# Patient Record
Sex: Male | Born: 1980 | Race: Black or African American | Hispanic: No | Marital: Married | Smoking: Never smoker
Health system: Southern US, Community
[De-identification: ages and names within clinical notes are randomized; demographics above are authoritative.]

## PROBLEM LIST (undated history)

## (undated) ENCOUNTER — Emergency Department (HOSPITAL_COMMUNITY): Discharge status: Against Medical Advice | Payer: Self-pay

## (undated) DIAGNOSIS — I10 Essential (primary) hypertension: Secondary | ICD-10-CM

## (undated) HISTORY — PX: EYE SURGERY: SHX253

## (undated) HISTORY — PX: RETINAL DETACHMENT SURGERY: SHX105

---
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
## 2003-12-06 ENCOUNTER — Encounter: Admission: RE | Admit: 2003-12-06 | Discharge: 2003-12-06 | Payer: Self-pay | Admitting: Internal Medicine

## 2004-07-09 ENCOUNTER — Emergency Department (HOSPITAL_COMMUNITY): Admission: EM | Admit: 2004-07-09 | Discharge: 2004-07-10 | Payer: Self-pay | Admitting: Emergency Medicine

## 2006-01-09 ENCOUNTER — Emergency Department (HOSPITAL_COMMUNITY): Admission: EM | Admit: 2006-01-09 | Discharge: 2006-01-09 | Payer: Self-pay | Admitting: Emergency Medicine

## 2008-08-26 ENCOUNTER — Emergency Department (HOSPITAL_COMMUNITY): Admission: EM | Admit: 2008-08-26 | Discharge: 2008-08-26 | Payer: Self-pay | Admitting: Emergency Medicine

## 2009-02-15 ENCOUNTER — Emergency Department (HOSPITAL_COMMUNITY): Admission: EM | Admit: 2009-02-15 | Discharge: 2009-02-15 | Payer: Self-pay | Admitting: Emergency Medicine

## 2010-08-15 LAB — GC/CHLAMYDIA PROBE AMP, GENITAL
Chlamydia, DNA Probe: NEGATIVE
GC Probe Amp, Genital: NEGATIVE

## 2012-12-17 ENCOUNTER — Emergency Department (HOSPITAL_COMMUNITY)
Admission: EM | Admit: 2012-12-17 | Discharge: 2012-12-17 | Disposition: A | Discharge status: Home / Self Care | Payer: Self-pay | Attending: Emergency Medicine | Admitting: Emergency Medicine

## 2012-12-17 ENCOUNTER — Encounter (HOSPITAL_COMMUNITY): Payer: Self-pay | Admitting: Emergency Medicine

## 2012-12-17 DIAGNOSIS — M545 Low back pain, unspecified: Secondary | ICD-10-CM | POA: Insufficient documentation

## 2012-12-17 DIAGNOSIS — M79604 Pain in right leg: Secondary | ICD-10-CM

## 2012-12-17 MED ORDER — ACETAMINOPHEN 500 MG PO TABS
1000.0000 mg | ORAL_TABLET | Freq: Once | ORAL | Status: AC
  Administered 2012-12-17: 1000 mg via ORAL
  Filled 2012-12-17: qty 2

## 2012-12-17 MED ORDER — DIAZEPAM 5 MG PO TABS: 5.0000 mg | ORAL_TABLET | Freq: Two times a day (BID) | ORAL | Status: DC

## 2012-12-17 MED ORDER — DIAZEPAM 5 MG PO TABS
10.0000 mg | ORAL_TABLET | Freq: Once | ORAL | Status: AC
  Administered 2012-12-17: 10 mg via ORAL
  Filled 2012-12-17: qty 2

## 2012-12-17 NOTE — ED Provider Notes (Signed)
CSN: [id number]     Arrival date & time 12/17/12  2101 History    This chart was scribed for Junius Finner, PA working with Toy Baker, MD by Quintella Reichert, ED Scribe. This patient was seen in room WTR9/WTR9 and the patient's care was started at 9:27 PM.     Chief Complaint  Patient presents with  . Back Pain    The history is provided by the patient. No language interpreter was used.    HPI Comments: Brandon Calderon is a 32 y.o. male who presents to the Emergency Department complaining of 2-3 days of severe lower back pain.  Pain is localized all the way across the lower back and radiates into bilateral buttocks and posterior upper legs.  It is described as stabbing and sharp and rated at a severity of "9, close to 10"/10.   He states that he took "arthritis pills" and "pain pills," without relief.  Pain is exacerbated by standing and walking.  He is ambulatory but notes that he has to place his hands on his knees to stand up from a seated position.  He denies numbness or tingling in groin or legs, upper back pain, neck pain, bowel or bladder incontinence, abdominal pain, fever, or any other associated symptoms.  Pt works as a Location manager but denies any recent injuries or unusual activities.  He notes that he was in an accident in 2006 or 2007 and has occasionally had similar pain since then but his present episode is more persistent.  He denies chronic medical conditions or regular medication usage.     Pt has no PCP   History reviewed. No pertinent past medical history.   History reviewed. No pertinent past surgical history.   No family history on file.   History  Substance Use Topics  . Smoking status: Never Smoker   . Smokeless tobacco: Not on file  . Alcohol Use: Yes     Comment: social     Review of Systems  Constitutional: Negative for fever.  HENT: Negative for neck pain.   Gastrointestinal: Negative for abdominal pain and diarrhea.  Genitourinary:  Negative for urgency.  Musculoskeletal: Positive for back pain.  Neurological: Negative for numbness.  All other systems reviewed and are negative.      Allergies  Aspirin  Home Medications   Current Outpatient Rx  Name  Route  Sig  Dispense  Refill  . acetaminophen (TYLENOL) 650 MG CR tablet   Oral   Take 650 mg by mouth every 8 (eight) hours as needed for pain.         . diazepam (VALIUM) 5 MG tablet   Oral   Take 1 tablet (5 mg total) by mouth 2 (two) times daily.   10 tablet   0    BP 142/90  Pulse 83  Temp(Src) 99.1 F (37.3 C) (Oral)  Resp 18  SpO2 100%  Physical Exam  Nursing note and vitals reviewed. Constitutional: He is oriented to person, place, and time. He appears well-developed and well-nourished. No distress.  HENT:  Head: Normocephalic and atraumatic.  Eyes: EOM are normal.  Neck: Neck supple. No tracheal deviation present.  Cardiovascular: Normal rate.   Pulmonary/Chest: Effort normal. No respiratory distress.  Musculoskeletal: Normal range of motion.       Lumbar back: He exhibits tenderness. He exhibits no bony tenderness.  Tenderness to muscles of lower lumbar region and buttocks. No midline or bony spinal tenderness. No step-offs or crepitus.  Neurological:  He is alert and oriented to person, place, and time. Gait abnormal.  Antalgic gait.  Skin: Skin is warm and dry.  Psychiatric: He has a normal mood and affect. His behavior is normal.    ED Course  Procedures (including critical care time)  DIAGNOSTIC STUDIES: Oxygen Saturation is 100% on room air, normal by my interpretation.    COORDINATION OF CARE: 9:29 PM-Discussed treatment plan which includes pain medication, muscle relaxants and f/u with [facility] with pt at bedside and pt agreed to plan.    Labs Reviewed - No data to display  No results found.  1. LBP radiating to both legs     MDM  Pt c/o back pain.  Not concerned for emergent process taking place at  this time.  Will tx with muscle relaxer.  Return precautions given.  Pt verbalized understanding and agreement with tx plan.    I personally performed the services described in this documentation, which was scribed in my presence. The recorded information has been reviewed and is accurate.     Junius Finner, PA-C 12/19/12 2016

## 2012-12-17 NOTE — ED Notes (Signed)
Patient reports that he has times with intermittent back pain. He has never had any last this long

## 2012-12-21 ENCOUNTER — Telehealth (HOSPITAL_COMMUNITY): Payer: Self-pay | Admitting: Emergency Medicine

## 2012-12-21 NOTE — Telephone Encounter (Signed)
Patient called because he wants to return to work but his supervisor wants clarification of his clearance of lifting more than 25lbs. Informed patient that he would need to see another doctor at the ed to get cleared or his own pcp. Pt verbalizes understanding.

## 2012-12-23 NOTE — ED Provider Notes (Signed)
Medical screening examination/treatment/procedure(s) were performed by non-physician practitioner and as supervising physician I was immediately available for consultation/collaboration.  Namir Neto T Ruford Dudzinski, MD 12/23/12 1202 

## 2013-05-13 ENCOUNTER — Emergency Department (INDEPENDENT_AMBULATORY_CARE_PROVIDER_SITE_OTHER)
Admission: EM | Admit: 2013-05-13 | Discharge: 2013-05-13 | Disposition: A | Discharge status: Home / Self Care | Payer: Commercial Managed Care - PPO | Source: Home / Self Care | Attending: Family Medicine | Admitting: Family Medicine

## 2013-05-13 ENCOUNTER — Encounter (HOSPITAL_COMMUNITY): Payer: Self-pay | Admitting: Emergency Medicine

## 2013-05-13 ENCOUNTER — Emergency Department (INDEPENDENT_AMBULATORY_CARE_PROVIDER_SITE_OTHER): Payer: Commercial Managed Care - PPO

## 2013-05-13 DIAGNOSIS — J069 Acute upper respiratory infection, unspecified: Secondary | ICD-10-CM

## 2013-05-13 MED ORDER — HYDROCOD POLST-CHLORPHEN POLST 10-8 MG/5ML PO LQCR: 5.0000 mL | Freq: Two times a day (BID) | ORAL | Status: DC | PRN

## 2013-05-13 NOTE — Discharge Instructions (Signed)
Antibiotic Nonuse ° Your caregiver felt that the infection or problem was not one that would be helped with an antibiotic. °Infections may be caused by viruses or bacteria. Only a caregiver can tell which one of these is the likely cause of an illness. A cold is the most common cause of infection in both adults and children. A cold is a virus. Antibiotic treatment will have no effect on a viral infection. Viruses can lead to many lost days of work caring for sick children and many missed days of school. Children may catch as many as 10 "colds" or "flus" per year during which they can be tearful, cranky, and uncomfortable. The goal of treating a virus is aimed at keeping the ill person comfortable. °Antibiotics are medications used to help the body fight bacterial infections. There are relatively few types of bacteria that cause infections but there are hundreds of viruses. While both viruses and bacteria cause infection they are very different types of germs. A viral infection will typically go away by itself within 7 to 10 days. Bacterial infections may spread or get worse without antibiotic treatment. °Examples of bacterial infections are: °· Sore throats (like strep throat or tonsillitis). °· Infection in the lung (pneumonia). °· Ear and skin infections. °Examples of viral infections are: °· Colds or flus. °· Most coughs and bronchitis. °· Sore throats not caused by Strep. °· Runny noses. °It is often best not to take an antibiotic when a viral infection is the cause of the problem. Antibiotics can kill off the helpful bacteria that we have inside our body and allow harmful bacteria to start growing. Antibiotics can cause side effects such as allergies, nausea, and diarrhea without helping to improve the symptoms of the viral infection. Additionally, repeated uses of antibiotics can cause bacteria inside of our body to become resistant. That resistance can be passed onto harmful bacterial. The next time you have  an infection it may be harder to treat if antibiotics are used when they are not needed. Not treating with antibiotics allows our own immune system to develop and take care of infections more efficiently. Also, antibiotics will work better for us when they are prescribed for bacterial infections. °Treatments for a child that is ill may include: °· Give extra fluids throughout the day to stay hydrated. °· Get plenty of rest. °· Only give your child over-the-counter or prescription medicines for pain, discomfort, or fever as directed by your caregiver. °· The use of a cool mist humidifier may help stuffy noses. °· Cold medications if suggested by your caregiver. °Your caregiver may decide to start you on an antibiotic if: °· The problem you were seen for today continues for a longer length of time than expected. °· You develop a secondary bacterial infection. °SEEK MEDICAL CARE IF: °· Fever lasts longer than 5 days. °· Symptoms continue to get worse after 5 to 7 days or become severe. °· Difficulty in breathing develops. °· Signs of dehydration develop (poor drinking, rare urinating, dark colored urine). °· Changes in behavior or worsening tiredness (listlessness or lethargy). °Document Released: 07/07/2001 Document Revised: 07/21/2011 Document Reviewed: 01/03/2009 °ExitCare® Patient Information ©2014 [employer]. ° °Upper Respiratory Infection, Adult °An upper respiratory infection (URI) is also sometimes known as the common cold. The upper respiratory tract includes the nose, sinuses, throat, trachea, and bronchi. Bronchi are the airways leading to the lungs. Most people improve within 1 week, but symptoms can last up to 2 weeks. A residual cough may   last even longer.  °CAUSES °Many different viruses can infect the tissues lining the upper respiratory tract. The tissues become irritated and inflamed and often become very moist. Mucus production is also common. A cold is contagious. You can easily spread the virus  to others by oral contact. This includes kissing, sharing a glass, coughing, or sneezing. Touching your mouth or nose and then touching a surface, which is then touched by another person, can also spread the virus. °SYMPTOMS  °Symptoms typically develop 1 to 3 days after you come in contact with a cold virus. Symptoms vary from person to person. They may include: °· Runny nose. °· Sneezing. °· Nasal congestion. °· Sinus irritation. °· Sore throat. °· Loss of voice (laryngitis). °· Cough. °· Fatigue. °· Muscle aches. °· Loss of appetite. °· Headache. °· Low-grade fever. °DIAGNOSIS  °You might diagnose your own cold based on familiar symptoms, since most people get a cold 2 to 3 times a year. Your caregiver can confirm this based on your exam. Most importantly, your caregiver can check that your symptoms are not due to another disease such as strep throat, sinusitis, pneumonia, asthma, or epiglottitis. Blood tests, throat tests, and X-rays are not necessary to diagnose a common cold, but they may sometimes be helpful in excluding other more serious diseases. Your caregiver will decide if any further tests are required. °RISKS AND COMPLICATIONS  °You may be at risk for a more severe case of the common cold if you smoke cigarettes, have chronic heart disease (such as heart failure) or lung disease (such as asthma), or if you have a weakened immune system. The very young and very old are also at risk for more serious infections. Bacterial sinusitis, middle ear infections, and bacterial pneumonia can complicate the common cold. The common cold can worsen asthma and chronic obstructive pulmonary disease (COPD). Sometimes, these complications can require emergency medical care and may be life-threatening. °PREVENTION  °The best way to protect against getting a cold is to practice good hygiene. Avoid oral or hand contact with people with cold symptoms. Wash your hands often if contact occurs. There is no clear evidence that  vitamin C, vitamin E, echinacea, or exercise reduces the chance of developing a cold. However, it is always recommended to get plenty of rest and practice good nutrition. °TREATMENT  °Treatment is directed at relieving symptoms. There is no cure. Antibiotics are not effective, because the infection is caused by a virus, not by bacteria. Treatment may include: °· Increased fluid intake. Sports drinks offer valuable electrolytes, sugars, and fluids. °· Breathing heated mist or steam (vaporizer or shower). °· Eating chicken soup or other clear broths, and maintaining good nutrition. °· Getting plenty of rest. °· Using gargles or lozenges for comfort. °· Controlling fevers with ibuprofen or acetaminophen as directed by your caregiver. °· Increasing usage of your inhaler if you have asthma. °Zinc gel and zinc lozenges, taken in the first 24 hours of the common cold, can shorten the duration and lessen the severity of symptoms. Pain medicines may help with fever, muscle aches, and throat pain. A variety of non-prescription medicines are available to treat congestion and runny nose. Your caregiver can make recommendations and may suggest nasal or lung inhalers for other symptoms.  °HOME CARE INSTRUCTIONS  °· Only take over-the-counter or prescription medicines for pain, discomfort, or fever as directed by your caregiver. °· Use a warm mist humidifier or inhale steam from a shower to increase air moisture. This may keep   secretions moist and make it easier to breathe. °· Drink enough water and fluids to keep your urine clear or pale yellow. °· Rest as needed. °· Return to work when your temperature has returned to normal or as your caregiver advises. You may need to stay home longer to avoid infecting others. You can also use a face mask and careful hand washing to prevent spread of the virus. °SEEK MEDICAL CARE IF:  °· After the first few days, you feel you are getting worse rather than better. °· You need your caregiver's  advice about medicines to control symptoms. °· You develop chills, worsening shortness of breath, or brown or red sputum. These may be signs of pneumonia. °· You develop yellow or brown nasal discharge or pain in the face, especially when you bend forward. These may be signs of sinusitis. °· You develop a fever, swollen neck glands, pain with swallowing, or white areas in the back of your throat. These may be signs of strep throat. °SEEK IMMEDIATE MEDICAL CARE IF:  °· You have a fever. °· You develop severe or persistent headache, ear pain, sinus pain, or chest pain. °· You develop wheezing, a prolonged cough, cough up blood, or have a change in your usual mucus (if you have chronic lung disease). °· You develop sore muscles or a stiff neck. °Document Released: 10/22/2000 Document Revised: 07/21/2011 Document Reviewed: 08/30/2010 °ExitCare® Patient Information ©2014 [employer]. ° °

## 2013-05-13 NOTE — ED Provider Notes (Signed)
Medical screening examination/treatment/procedure(s) were performed by resident physician or non-physician practitioner and as supervising physician I was immediately available for consultation/collaboration.   Oluwaseyi Raffel DOUGLAS MD.   Goku Harb D Alphonza Tramell, MD 05/13/13 1904 

## 2013-05-13 NOTE — ED Notes (Signed)
Waiting discharge papers 

## 2013-05-13 NOTE — ED Notes (Signed)
Pt triaged and assessed by provider.   Provider in before nurse. 

## 2013-05-13 NOTE — ED Provider Notes (Signed)
CSN: [id number]     Arrival date & time 05/13/13  1056 History   First MD Initiated Contact with Patient 05/13/13 1252     Chief Complaint  Patient presents with  . URI   (Consider location/radiation/quality/duration/timing/severity/associated sxs/prior Treatment) HPI Comments: 33 year old male presents complaining of body aches, cough, sore throat, fever, and one episode of vomiting. This has been going on for approximately 2 days. It began as a sore throat and body aches. The cough started soon after. He checked his temperature yesterday it was right at 100. He checked it later in the day and it was 100.8. He has been taking TheraFlu which has not been helping significantly. He has 2 known sick contacts, his mom has pneumonia and his daughter has a cold. When he woke up this morning, he had one episode of vomiting but has not had any nausea or vomiting since. He is not feeling short of breath has no pleuritic chest pain  Patient is a 33 y.o. male presenting with URI.  URI Presenting symptoms: cough, fever and sore throat   Presenting symptoms: no congestion and no fatigue   Associated symptoms: no arthralgias, no myalgias, no neck pain and no wheezing     History reviewed. No pertinent past medical history. History reviewed. No pertinent past surgical history. History reviewed. No pertinent family history. History  Substance Use Topics  . Smoking status: Never Smoker   . Smokeless tobacco: Not on file  . Alcohol Use: Yes     Comment: social    Review of Systems  Constitutional: Positive for fever. Negative for chills and fatigue.  HENT: Positive for sore throat. Negative for congestion and postnasal drip.   Eyes: Negative for visual disturbance.  Respiratory: Positive for cough. Negative for shortness of breath and wheezing.   Cardiovascular: Negative for chest pain, palpitations and leg swelling.  Gastrointestinal: Negative for nausea, vomiting, abdominal pain, diarrhea and  constipation.  Genitourinary: Negative for dysuria, urgency, frequency and hematuria.  Musculoskeletal: Negative for arthralgias, myalgias, neck pain and neck stiffness.  Skin: Negative for rash.  Neurological: Negative for dizziness, weakness and light-headedness.    Allergies  Aspirin  Home Medications   Current Outpatient Rx  Name  Route  Sig  Dispense  Refill  . acetaminophen (TYLENOL) 650 MG CR tablet   Oral   Take 650 mg by mouth every 8 (eight) hours as needed for pain.         . chlorpheniramine-HYDROcodone (TUSSIONEX) 10-8 MG/5ML LQCR   Oral   Take 5 mLs by mouth every 12 (twelve) hours as needed for cough.   115 mL   0   . diazepam (VALIUM) 5 MG tablet   Oral   Take 1 tablet (5 mg total) by mouth 2 (two) times daily.   10 tablet   0    BP 149/97  Pulse 90  Temp(Src) 98.8 F (37.1 C) (Oral)  Resp 16  SpO2 98% Physical Exam  Nursing note and vitals reviewed. Constitutional: He is oriented to person, place, and time. He appears well-developed and well-nourished. No distress.  HENT:  Head: Normocephalic and atraumatic.  Right Ear: External ear normal.  Left Ear: External ear normal.  Nose: Nose normal.  Mouth/Throat: Oropharynx is clear and moist. No oropharyngeal exudate.  Eyes: Pupils are equal, round, and reactive to light.  Neck: Normal range of motion. Neck supple.  Cardiovascular: Normal rate, regular rhythm and normal heart sounds.  Exam reveals no gallop and no friction rub.  No murmur heard. Pulmonary/Chest: Effort normal. No respiratory distress. He has no wheezes. He has rales (left lower lung field).  Lymphadenopathy:    He has no cervical adenopathy.  Neurological: He is alert and oriented to person, place, and time. Coordination normal.  Skin: Skin is warm and dry. No rash noted. He is not diaphoretic.  Psychiatric: He has a normal mood and affect. Judgment normal.    ED Course  Procedures (including critical care time) Labs  Review Labs Reviewed - No data to display Imaging Review Dg Chest 2 View  05/13/2013   CLINICAL DATA:  Cough.  EXAM: CHEST  2 VIEW  COMPARISON:  July 09, 2004.  FINDINGS: The heart size and mediastinal contours are within normal limits. Both lungs are clear. No pleural effusion or pneumothorax is noted. The visualized skeletal structures are unremarkable.  IMPRESSION: No active cardiopulmonary disease.   Electronically Signed   By: Roque Lias M.D.   On: 05/13/2013 13:18      MDM   1. URI (upper respiratory infection)    No radiographic evidence of pna.  Treat URI symptomatically with tussionex. F/u PRN    Meds ordered this encounter  Medications  . chlorpheniramine-HYDROcodone (TUSSIONEX) 10-8 MG/5ML LQCR    Sig: Take 5 mLs by mouth every 12 (twelve) hours as needed for cough.    Dispense:  115 mL    Refill:  0    Order Specific Question:  Supervising Provider    Answer:  Bradd Canary D [[id number]]       Graylon Good, PA-C 05/13/13 [phone]

## 2017-04-29 NOTE — Progress Notes (Signed)
[facility] - Clinic Note  04/30/2017     CHIEF COMPLAINT Patient presents for Retina Evaluation   HISTORY OF PRESENT ILLNESS: Brandon Calderon is a 36 y.o. male who presents to the clinic today for:   HPI    Retina Evaluation    In both eyes.  This started 6 months ago.  Associated Symptoms Flashes, Pain and Redness.  Negative for Floaters, Distortion, Trauma, Shoulder/Hip pain, Fatigue, Weight Loss, Jaw Claudication, Glare, Blind Spot, Photophobia, Scalp Tenderness and Fever.  Context:  distance vision, mid-range vision and near vision.  Response to treatment was mild improvement.  I, the attending physician,  performed the HPI with the patient and updated documentation appropriately.          Comments    Pt presents today for retinal evaluation on a self referral; pt states his IOP OS has been high since June-July of this year, he has had 2 laser surgeries OS, pt states they helped bring IOP down some, but it "shoots right back up" pt states he only sees blackness OS, occasionally seeing flashes of light from the corner of his eye; pt states he had a RD OD and had oil and a buckle put it, pt states they took the oil and buckle out and OD detached again 2 days later, pt states they put the oil and buckle back in again and say they are unable to take the oil out until OS IOP is down, pt states he has scar tissue OD making vision in that eye poor as well, pt states he has been told he may need a drain OD or have the eye taken out altogether; pt states he has been told his OS nerve  Is "shot" because of the high IOP       Last edited by Posey BoyerBrown, Amanda J, COT on 04/30/2017 11:04 AM. (History)    Pt states that he is here to have a second opinion of keeping IOP down OS; Pt states that he has been treated by Dr. Lottie DawsonBond for elevated IOP; Pt states that his aunt has uncontrolled IOP and they used injections for her; Pt states that after he had retinal detachment sx with with  Dr. Sherryll BurgerShah the oil in OS began to "break up" and they "couldn't get it all out" which he believes was the cause of IOP rising; Pt states that he has been followed by Dr. Lottie DawsonBond for IOP, pt states that he has had CPC OS, states that the next day IOP was 27, 1 week after IOP was 34, Dr. Lottie DawsonBond upped Pred, pt states that at next appointment IOP was in the 40s; Pt states that he has been told to use the Pred to reduce inflammation but states that it continues to raise IOP;   Referring physician: No referring provider defined for this encounter.  HISTORICAL INFORMATION:   Selected notes from the MEDICAL RECORD NUMBER Referred by Self for retinal evaluation LEE- 12.03.18 (J. Bond) [BCVA OD: 20/200 +1 OS: LP] Ocular Hx- lattice degen OS, ocular hypertension, pseudophakia OU (2015, Surgeon: Valere DrossGiegengack), proliferative vitreoretinopathy OD, S/P DIOD laser CPC OS (10.12.18, 11.27.18 - Bond), S/P indirect laser ophthalmoscopy OD (02.01.17 - Shah), S/P 23g PPV w/ oil removal OD (09.19.16 - C. Greven), S/P 23g PPV w/ oil OD (10.03.16 - C. Greven), RD OS with multipule breaks, S/P PPV 25g and SBP with laser, PFL, oil OS (03.30.16 - [place]), S/P SBP w/ 23g PPV OS (06/13/15 Sherryll Burger- Shah), S/P 23g  PPV w/ oil OS (10.11.17 - Shah) S/P 23g PPV w/ laser OS (02.07.18), currently on Pred OS q2hr, Brimonidine OS TID, Dorz-Tim OS BID, Atropine OS TID PMH- former smoker    CURRENT MEDICATIONS: No current outpatient medications on file. (Ophthalmic Drugs)   No current facility-administered medications for this visit.  (Ophthalmic Drugs)   Current Outpatient Medications (Other)  Medication Sig  . acetaminophen (TYLENOL) 650 MG CR tablet Take 650 mg by mouth every 8 (eight) hours as needed for pain.  . chlorpheniramine-HYDROcodone (TUSSIONEX) 10-8 MG/5ML LQCR Take 5 mLs by mouth every 12 (twelve) hours as needed for cough.  . diazepam (VALIUM) 5 MG tablet Take 1 tablet (5 mg total) by mouth 2 (two) times daily.   No current  facility-administered medications for this visit.  (Other)      REVIEW OF SYSTEMS:    ALLERGIES Allergies  Allergen Reactions  . Aspirin Hives    PAST MEDICAL HISTORY History reviewed. No pertinent past medical history. Past Surgical History:  Procedure Laterality Date  . RETINAL DETACHMENT SURGERY      FAMILY HISTORY History reviewed. No pertinent family history.  SOCIAL HISTORY Social History   Tobacco Use  . Smoking status: Never Smoker  . Smokeless tobacco: Never Used  Substance Use Topics  . Alcohol use: Yes    Comment: social  . Drug use: No         OPHTHALMIC EXAM:  Base Eye Exam    Visual Acuity (Snellen - Linear)      Right Left   Dist [place] 20/150 NLP   Dist ph [place] NI NLP       Tonometry (Applanation, 8:53 AM)      Right Left   Pressure 16 59       Tonometry #2 (Tonopen, 8:54 AM)      Right Left   Pressure  48       Tonometry Comments   Pt denies pain os       Pupils      Dark Light Shape React APD   Right 4 3 Round Slow None   Left 4 3 Round Minimal None       Visual Fields (Counting fingers)      Left Right   Restrictions Total superior temporal, inferior temporal, superior nasal, inferior nasal deficiencies Partial outer superior temporal, inferior temporal, superior nasal, inferior nasal deficiencies       Extraocular Movement      Right Left    Full, Ortho Full, Ortho       Neuro/Psych    Oriented x3:  Yes   Mood/Affect:  Normal       Dilation    Both eyes:  1.0% Mydriacyl, 2.5% Phenylephrine @ 9:26 AM        Slit Lamp and Fundus Exam    Slit Lamp Exam      Right Left   Lids/Lashes Normal    Conjunctiva/Sclera Melanosis otherwise normal 1+ Injection, Melanosis   Cornea Clear Trace Edema and haze, endopigment infero temporal   Anterior Chamber Deep and quiet 2+ Cell and debris, deep   Iris Round and dilated Irregular pupil   Lens PC IOL in good postion, few oil bubbles on opic PC IOL with pigment on optic    Vitreous silicon oil silicon oil       Fundus Exam      Right Left   Disc harp margin, 2-3+ Pallor, Peripapillary atrophy hazy view; pallor   Macula Flat, pigmented  scarring and fibrosis, circumferential fibrosis infernasal quadrant with traction inferotemporal to buckle hazy view   Vessels Vascular attenuation vascular attenuation   Periphery Scleral buckle with overlying fibrosis and scarring SB; laser; fibrosis        Refraction    Wearing Rx      Sphere Cylinder Axis   Right +0.50 +1.75 108   Left -2.50 +0.75 063   Age:  [age]m       Manifest Refraction (Retinoscopy)      Sphere Cylinder Axis Dist VA   Right -0.50 +1.50 058 20/150   Left unable     No reflex os          IMAGING AND PROCEDURES  Imaging and Procedures for 05/03/17  OCT, Retina - OU - Both Eyes     Right Eye Quality was poor. Central Foveal Thickness: 592. Progression has no prior data. Findings include abnormal foveal contour, subretinal fluid, intraretinal hyper-reflective material, outer retinal atrophy, inner retinal atrophy.   Left Eye Quality was poor. Progression has no prior data. Findings include (uninterruptable image).   Notes *Images captured and stored on drive  Diagnosis / Impression:  OD: hx of retinal detachment , diffuse retinal atrophy and residual SRF OS: no view  Clinical management:  See below  Abbreviations: NFP - Normal foveal profile. CME - cystoid macular edema. PED - pigment epithelial detachment. IRF - intraretinal fluid. SRF - subretinal fluid. EZ - ellipsoid zone. ERM - epiretinal membrane. ORA - outer retinal atrophy. ORT - outer retinal tubulation. SRHM - subretinal hyper-reflective material                  ASSESSMENT/PLAN:    ICD-10-CM   1. Retinal edema H35.81 OCT, Retina - OU - Both Eyes  2. 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  Ocular hypertension OS  - pt here for second opinion regarding IOP issues OS  - pt with complex ocular  history OS which includes SBP and PPV w/ silicon oil and elevated IOP s/p CPC x2 w/ Dr. Lottie DawsonBond  - IOP remains severely elevated in an eye with LP/NLP vision  - discussed severity and complexity of problem w/ juxtaposition of IOP in the setting of severe inflammation and need for steroids  - advised [facility] and compliance with their management  - pt verbalized understanding and was in agreement with plan  2. History of retina detachment OU  - both eyes with PVR  - OD with VA of 20/150 despite quite extensive PVR and fibrosis under silicon oil  - [facility]  3. Retinal edema  Ophthalmic Meds Ordered this visit:  No orders of the defined types were placed in this encounter.      Return as scheduled with Dr. Sherryll BurgerShah, if symptoms worsen or fail to improve.  There are no Patient Instructions on file for this visit.   Explained the diagnoses, plan, and follow up with the patient and they expressed understanding.  Patient expressed understanding of the importance of proper follow up care.   Karie ChimeraBrian G. Valicia Rief, M.D., Ph.D. Diseases & Surgery of the Retina and Vitreous [facility] 05/03/17     Abbreviations: M myopia (nearsighted); A astigmatism; H hyperopia (farsighted); P presbyopia; Mrx spectacle prescription;  CTL contact lenses; OD right eye; OS left eye; OU both eyes  XT exotropia; ET esotropia; PEK punctate epithelial keratitis; PEE  punctate epithelial erosions; DES dry eye syndrome; MGD meibomian gland dysfunction; ATs artificial tears; PFAT's preservative free artificial tears; NSC nuclear sclerotic cataract; PSC posterior subcapsular cataract; ERM epi-retinal membrane; PVD posterior vitreous detachment; RD retinal detachment; DM diabetes mellitus; DR diabetic retinopathy; NPDR non-proliferative diabetic retinopathy; PDR proliferative diabetic retinopathy; CSME clinically  significant macular edema; DME diabetic macular edema; dbh dot blot hemorrhages; CWS cotton wool spot; POAG primary open angle glaucoma; C/D cup-to-disc ratio; HVF humphrey visual field; GVF goldmann visual field; OCT optical coherence tomography; IOP intraocular pressure; BRVO Branch retinal vein occlusion; CRVO central retinal vein occlusion; CRAO central retinal artery occlusion; BRAO branch retinal artery occlusion; RT retinal tear; SB scleral buckle; PPV pars plana vitrectomy; VH Vitreous hemorrhage; PRP panretinal laser photocoagulation; IVK intravitreal kenalog; VMT vitreomacular traction; MH Macular hole;  NVD neovascularization of the disc; NVE neovascularization elsewhere; AREDS age related eye disease study; ARMD age related macular degeneration; POAG primary open angle glaucoma; EBMD epithelial/anterior basement membrane dystrophy; ACIOL anterior chamber intraocular lens; IOL intraocular lens; PCIOL posterior chamber intraocular lens; Phaco/IOL phacoemulsification with intraocular lens placement; PRK photorefractive keratectomy; LASIK laser assisted in situ keratomileusis; HTN hypertension; DM diabetes mellitus; COPD chronic obstructive pulmonary disease

## 2017-04-30 ENCOUNTER — Ambulatory Visit (INDEPENDENT_AMBULATORY_CARE_PROVIDER_SITE_OTHER): Payer: Commercial Managed Care - PPO | Admitting: Ophthalmology

## 2017-04-30 ENCOUNTER — Encounter (INDEPENDENT_AMBULATORY_CARE_PROVIDER_SITE_OTHER): Payer: Self-pay | Admitting: Ophthalmology

## 2017-04-30 DIAGNOSIS — H3581 Retinal edema: Secondary | ICD-10-CM

## 2017-04-30 DIAGNOSIS — H40052 Ocular hypertension, left eye: Secondary | ICD-10-CM | POA: Diagnosis not present

## 2017-04-30 DIAGNOSIS — Z8669 Personal history of other diseases of the nervous system and sense organs: Secondary | ICD-10-CM

## 2017-05-03 ENCOUNTER — Encounter (INDEPENDENT_AMBULATORY_CARE_PROVIDER_SITE_OTHER): Payer: Self-pay | Admitting: Ophthalmology

## 2017-05-13 ENCOUNTER — Encounter (INDEPENDENT_AMBULATORY_CARE_PROVIDER_SITE_OTHER): Payer: Self-pay | Admitting: Ophthalmology

## 2017-10-23 ENCOUNTER — Emergency Department (HOSPITAL_COMMUNITY): Payer: Self-pay

## 2017-10-23 ENCOUNTER — Emergency Department (HOSPITAL_COMMUNITY)
Admission: EM | Admit: 2017-10-23 | Discharge: 2017-10-23 | Disposition: A | Discharge status: Home / Self Care | Payer: Self-pay | Attending: Emergency Medicine | Admitting: Emergency Medicine

## 2017-10-23 ENCOUNTER — Other Ambulatory Visit: Payer: Self-pay

## 2017-10-23 ENCOUNTER — Encounter (HOSPITAL_COMMUNITY): Payer: Self-pay

## 2017-10-23 DIAGNOSIS — I160 Hypertensive urgency: Secondary | ICD-10-CM | POA: Insufficient documentation

## 2017-10-23 DIAGNOSIS — Z79899 Other long term (current) drug therapy: Secondary | ICD-10-CM | POA: Insufficient documentation

## 2017-10-23 DIAGNOSIS — R079 Chest pain, unspecified: Secondary | ICD-10-CM | POA: Insufficient documentation

## 2017-10-23 LAB — BASIC METABOLIC PANEL
Anion gap: 10 (ref 5–15)
BUN: 13 mg/dL (ref 6–20)
CO2: 27 mmol/L (ref 22–32)
Calcium: 9.7 mg/dL (ref 8.9–10.3)
Chloride: 103 mmol/L (ref 101–111)
Creatinine, Ser: 0.91 mg/dL (ref 0.61–1.24)
GFR calc Af Amer: >60 mL/min (ref >60)
GFR calc non Af Amer: >60 mL/min (ref >60)
GLUCOSE: 96 mg/dL (ref 65–99)
Potassium: 3.8 mmol/L (ref 3.5–5.1)
Sodium: 140 mmol/L (ref 135–145)

## 2017-10-23 LAB — CBC
HCT: 43.8 % (ref 39.0–52.0)
Hemoglobin: 15.2 g/dL (ref 13.0–17.0)
MCH: 30.2 pg (ref 26.0–34.0)
MCHC: 34.7 g/dL (ref 30.0–36.0)
MCV: 87.1 fL (ref 78.0–100.0)
Platelets: 270 10*3/uL (ref 150–400)
RBC: 5.03 MIL/uL (ref 4.22–5.81)
RDW: 13.1 % (ref 11.5–15.5)
WBC: 8.6 10*3/uL (ref 4.0–10.5)

## 2017-10-23 LAB — I-STAT TROPONIN, ED: Troponin i, poc: 0 ng/mL (ref 0.00–0.08)

## 2017-10-23 MED ORDER — HYDROCHLOROTHIAZIDE 25 MG PO TABS: 25.0000 mg | ORAL_TABLET | Freq: Every day | ORAL | 2 refills | Status: AC

## 2017-10-23 NOTE — ED Triage Notes (Signed)
Patient c/o intermittent chest pain x 2  And is worse with deep breath and sudden movements. Patient reports having a headache a few days ago and feels like his BP is elevated.

## 2017-10-23 NOTE — ED Provider Notes (Signed)
[facility]-EMERGENCY DEPT Provider Note   CSN: [id number] Arrival date & time: 10/23/17  1535     History   Chief Complaint Chief Complaint  Patient presents with  . Chest Pain  . Hypertension    HPI Brandon Calderon is a 37 y.o. male.  Prior history of retinal detachment surgery and probably a history of hypertension this is been noted in the past during his eye doctor visits.  He is coming in here today complaining of a headache that lasted all day about 2 days ago and he checked his blood pressure was high.  Headache resolved after rest yesterday but he still noticed a little bit of vague chest discomfort and feeling his breathing was little off-his blood pressure was still elevated.  He does not have a primary care doctor.  He does admit to some weight gain and lack of exercise but does not use any drugs or alcohol or smoking.  Currently has no chest pain or shortness of breath.  Blood pressure here is been with diastolics from a 115's.  There is been no change in his vision no other complaints.  The history is provided by the patient.  Hypertension  This is a new problem. The current episode started 2 days ago. The problem has been gradually improving. Associated symptoms include chest pain, headaches and shortness of breath. Pertinent negatives include no abdominal pain. Nothing aggravates the symptoms. The symptoms are relieved by relaxation and rest. He has tried food for the symptoms. The treatment provided mild relief.    History reviewed. No pertinent past medical history.  There are no active problems to display for this patient.   Past Surgical History:  Procedure Laterality Date  . EYE SURGERY    . RETINAL DETACHMENT SURGERY          Home Medications    Prior to Admission medications   Medication Sig Start Date End Date Taking? Authorizing Provider  acetaminophen (TYLENOL) 650 MG CR tablet Take 650 mg by mouth every 8 (eight) hours as needed  for pain.    [provider]  chlorpheniramine-HYDROcodone (TUSSIONEX) 10-8 MG/5ML LQCR Take 5 mLs by mouth every 12 (twelve) hours as needed for cough. 05/13/13   Baker, Adrian BlackwaterZachary H, PA-C  diazepam (VALIUM) 5 MG tablet Take 1 tablet (5 mg total) by mouth 2 (two) times daily. 12/17/12   Lurene ShadowPhelps, Erin O, PA-C  prednisoLONE acetate (PRED FORTE) 1 % ophthalmic suspension  09/23/17   [provider]    Family History History reviewed. No pertinent family history.  Social History Social History   Tobacco Use  . Smoking status: Never Smoker  . Smokeless tobacco: Never Used  Substance Use Topics  . Alcohol use: Yes    Comment: social  . Drug use: No     Allergies   Aspirin   Review of Systems Review of Systems  Constitutional: Negative for chills and fever.  HENT: Negative for ear pain and sore throat.   Eyes: Negative for pain and visual disturbance.  Respiratory: Positive for shortness of breath. Negative for cough.   Cardiovascular: Positive for chest pain. Negative for palpitations.  Gastrointestinal: Negative for abdominal pain and vomiting.  Genitourinary: Negative for dysuria and hematuria.  Musculoskeletal: Negative for arthralgias and back pain.  Skin: Negative for color change and rash.  Neurological: Positive for headaches. Negative for seizures and syncope.  All other systems reviewed and are negative.    Physical Exam Updated Vital Signs BP Marland Kitchen(!)  159/117 (BP Location: Right Arm)   Pulse 91   Temp 97.9 F (36.6 C) (Oral)   Resp 12   Ht 5\' 8"  (1.727 m)   SpO2 100%   Physical Exam  Constitutional: He appears well-developed and well-nourished.  HENT:  Head: Normocephalic and atraumatic.  Eyes: Conjunctivae are normal.  Neck: Neck supple.  Cardiovascular: Normal rate, regular rhythm and normal pulses.  No murmur heard. Pulmonary/Chest: Effort normal and breath sounds normal. No respiratory distress.  Abdominal: Soft. There is no tenderness.    Musculoskeletal: Normal range of motion. He exhibits no edema.       Right lower leg: He exhibits no tenderness and no edema.       Left lower leg: He exhibits no tenderness and no edema.  Neurological: He is alert.  Skin: Skin is warm and dry. Capillary refill takes less than 2 seconds.  Psychiatric: He has a normal mood and affect.  Nursing note and vitals reviewed.    ED Treatments / Results  Labs (all labs ordered are listed, but only abnormal results are displayed) Labs Reviewed  BASIC METABOLIC PANEL  CBC  I-STAT TROPONIN, ED    EKG EKG Interpretation  Date/Time:  Friday October 23 2017 17:04:14 EDT Ventricular Rate:  94 PR Interval:    QRS Duration: 79 QT Interval:  361 QTC Calculation: 452 R Axis:   43 Text Interpretation:  Sinus rhythm Probable left atrial enlargement Borderline T abnormalities, inferior leads similar to prior today Confirmed by Meridee Score [phone]) on 10/23/2017 5:13:39 PM   Radiology Dg Chest 2 View  Result Date: 10/23/2017 CLINICAL DATA:  Weakness, shortness of breath, chest pain, and headache. EXAM: CHEST - 2 VIEW COMPARISON:  05/13/2013 FINDINGS: The cardiomediastinal silhouette is within normal limits. The lungs are well inflated and clear. There is no evidence of pleural effusion or pneumothorax. No acute osseous abnormality is identified. IMPRESSION: No active cardiopulmonary disease. Electronically Signed   By: Sebastian Ache M.D.   On: 10/23/2017 15:59    Procedures Procedures (including critical care time)  Medications Ordered in ED Medications - No data to display   Initial Impression / Assessment and Plan / ED Course  I have reviewed the triage vital signs and the nursing notes.  Pertinent labs & imaging results that were available during my care of the patient were reviewed by me and considered in my medical decision making (see chart for details).  Clinical Course as of Oct 25 1027  Fri Oct 23, 2017  1716 Patient with likely  untreated primary hypertension.  He is got no complaints now has got normal renal function troponin negative EKG with no acute ST-T changes.  He had a headache 2 days ago but none now.  I did impress upon him the need to get set up with a primary care doctor and we would start him on some HCTZ for now but it would be important that he gets in with a doctor because he will likely need more meds added to this.  He understands to return if any worsening symptoms.   [MB]    Clinical Course User Index [MB] Terrilee Files, MD     Final Clinical Impressions(s) / ED Diagnoses   Final diagnoses:  Hypertensive urgency    ED Discharge Orders        Ordered    hydrochlorothiazide (HYDRODIURIL) 25 MG tablet  Daily     10/23/17 1718       Terrilee Files, MD  10/24/17 1029  

## 2017-10-23 NOTE — Discharge Instructions (Addendum)
Your evaluated in the emergency department for elevated blood pressure.  We did some basic lab work did not show any obvious signs of injury to your kidneys or heart.  We are starting on some blood pressure medicine and I will be important for you to establish care with a primary care doctor so this can be monitored.  Please return if any worsening headache chest pain shortness of breath or other concerns.

## 2019-06-11 ENCOUNTER — Encounter (HOSPITAL_COMMUNITY): Payer: Self-pay | Admitting: Emergency Medicine

## 2019-06-11 ENCOUNTER — Emergency Department (HOSPITAL_COMMUNITY): Payer: Medicare Other

## 2019-06-11 ENCOUNTER — Other Ambulatory Visit: Payer: Self-pay

## 2019-06-11 ENCOUNTER — Emergency Department (HOSPITAL_COMMUNITY)
Admission: EM | Admit: 2019-06-11 | Discharge: 2019-06-12 | Disposition: A | Discharge status: Home / Self Care | Payer: Medicare Other | Attending: Emergency Medicine | Admitting: Emergency Medicine

## 2019-06-11 DIAGNOSIS — I10 Essential (primary) hypertension: Secondary | ICD-10-CM | POA: Diagnosis not present

## 2019-06-11 DIAGNOSIS — R0789 Other chest pain: Secondary | ICD-10-CM | POA: Diagnosis present

## 2019-06-11 HISTORY — DX: Essential (primary) hypertension: I10

## 2019-06-11 LAB — CBC WITH DIFFERENTIAL/PLATELET
Abs Immature Granulocytes: 0.02 10*3/uL (ref 0.00–0.07)
Basophils Absolute: 0 10*3/uL (ref 0.0–0.1)
Basophils Relative: 0 %
Eosinophils Absolute: 0 10*3/uL (ref 0.0–0.5)
Eosinophils Relative: 0 %
HCT: 46.1 % (ref 39.0–52.0)
Hemoglobin: 15.5 g/dL (ref 13.0–17.0)
Immature Granulocytes: 0 %
Lymphocytes Relative: 37 %
Lymphs Abs: 2.6 10*3/uL (ref 0.7–4.0)
MCH: 29.3 pg (ref 26.0–34.0)
MCHC: 33.6 g/dL (ref 30.0–36.0)
MCV: 87.1 fL (ref 80.0–100.0)
Monocytes Absolute: 0.5 10*3/uL (ref 0.1–1.0)
Monocytes Relative: 7 %
Neutro Abs: 4 10*3/uL (ref 1.7–7.7)
Neutrophils Relative %: 56 %
Platelets: 306 10*3/uL (ref 150–400)
RBC: 5.29 MIL/uL (ref 4.22–5.81)
RDW: 13.3 % (ref 11.5–15.5)
WBC: 7.2 10*3/uL (ref 4.0–10.5)
nRBC: 0 % (ref 0.0–0.2)

## 2019-06-11 LAB — COMPREHENSIVE METABOLIC PANEL
ALT: 37 U/L (ref 0–44)
AST: 31 U/L (ref 15–41)
Albumin: 4.6 g/dL (ref 3.5–5.0)
Alkaline Phosphatase: 98 U/L (ref 38–126)
Anion gap: 12 (ref 5–15)
BUN: 15 mg/dL (ref 6–20)
CO2: 25 mmol/L (ref 22–32)
Calcium: 9.9 mg/dL (ref 8.9–10.3)
Chloride: 102 mmol/L (ref 98–111)
Creatinine, Ser: 1.02 mg/dL (ref 0.61–1.24)
GFR calc Af Amer: >60 mL/min (ref >60)
GFR calc non Af Amer: >60 mL/min (ref >60)
Glucose, Bld: 108 mg/dL — ABNORMAL HIGH (ref 70–99)
Potassium: 3.7 mmol/L (ref 3.5–5.1)
Sodium: 139 mmol/L (ref 135–145)
Total Bilirubin: 0.6 mg/dL (ref 0.3–1.2)
Total Protein: 8 g/dL (ref 6.5–8.1)

## 2019-06-11 LAB — D-DIMER, QUANTITATIVE: D-Dimer, Quant: 0.3 ug/mL-FEU (ref 0.00–0.50)

## 2019-06-11 LAB — TROPONIN I (HIGH SENSITIVITY): Troponin I (High Sensitivity): <2 ng/L (ref <18)

## 2019-06-11 NOTE — ED Provider Notes (Signed)
[facility] EMERGENCY DEPARTMENT Provider Note   CSN: [id number] Arrival date & time: 06/11/19  2014     History Chief Complaint  Patient presents with  . Chest Pain  . Shortness of Breath    Brandon Calderon is a 39 y.o. male.  39yo M w/ h/o HTN and glaucoma who p/w CP and SOB. CP started this morning when he was in the shower and has been present off and on today. Eventually it seem to resolve. Tonight at dinner, he had sharp, tight, non-radiating chest pain w/ SOB and felt like he couldn't catch his breath so girlfriend called EMS. EMS gave him NTG which improved pain and now CP is gone. No associated N/V, diaphoresis, cough/cold symptoms, or recent illness. He has never had this pain before. He denies any pain w/ inspiration, recent travel, h/o blood clots, or leg swelling. No FH heart disease in parents or siblings. No drug use.  He reports compliance w/ BP medication. Denies drug use.  The history is provided by the patient.  Chest Pain Associated symptoms: shortness of breath   Shortness of Breath Associated symptoms: chest pain        Past Medical History:  Diagnosis Date  . Hypertension     There are no problems to display for this patient.   Past Surgical History:  Procedure Laterality Date  . EYE SURGERY    . RETINAL DETACHMENT SURGERY         No family history on file.  Social History   Tobacco Use  . Smoking status: Never Smoker  . Smokeless tobacco: Never Used  Substance Use Topics  . Alcohol use: Yes    Comment: social  . Drug use: No    Home Medications Prior to Admission medications   Medication Sig Start Date End Date Taking? Authorizing Provider  chlorpheniramine-HYDROcodone (TUSSIONEX) 10-8 MG/5ML LQCR Take 5 mLs by mouth every 12 (twelve) hours as needed for cough. Patient not taking: Reported on 10/23/2017 05/13/13   Liam Ahles, PA-C  diazepam (VALIUM) 5 MG tablet Take 1 tablet (5 mg total) by mouth 2 (two) times  daily. Patient not taking: Reported on 10/23/2017 12/17/12   Noe Gens, PA-C  hydrochlorothiazide (HYDRODIURIL) 25 MG tablet Take 1 tablet (25 mg total) by mouth daily. 10/23/17   Hayden Rasmussen, MD  ibuprofen (ADVIL,MOTRIN) 200 MG tablet Take 200 mg by mouth every 6 (six) hours as needed for headache.    [provider]  prednisoLONE acetate (PRED FORTE) 1 % ophthalmic suspension Place 1 drop into the right eye 2 (two) times daily.  09/23/17   [provider]    Allergies    Aspirin  Review of Systems   Review of Systems  Respiratory: Positive for shortness of breath.   Cardiovascular: Positive for chest pain.   All other systems reviewed and are negative except that which was mentioned in HPI  Physical Exam Updated Vital Signs BP (!) 124/95   Pulse 94   Temp 98.8 F (37.1 C) (Oral)   Resp (!) 28   Ht 5\' 8"  (1.727 m)   Wt 93 kg   SpO2 99%   BMI 31.17 kg/m   Physical Exam Vitals and nursing note reviewed.  Constitutional:      General: He is not in acute distress.    Appearance: He is well-developed.  HENT:     Head: Normocephalic and atraumatic.  Eyes:     Conjunctiva/sclera: Conjunctivae normal.  Cardiovascular:  Rate and Rhythm: Regular rhythm. Tachycardia present.     Heart sounds: Normal heart sounds. No murmur.  Pulmonary:     Effort: Pulmonary effort is normal.     Breath sounds: Normal breath sounds.  Abdominal:     General: Bowel sounds are normal. There is no distension.     Palpations: Abdomen is soft.     Tenderness: There is no abdominal tenderness.  Musculoskeletal:     Cervical back: Neck supple.  Skin:    General: Skin is warm and dry.  Neurological:     Mental Status: He is alert and oriented to person, place, and time.     Comments: Fluent speech  Psychiatric:        Judgment: Judgment normal.     ED Results / Procedures / Treatments   Labs (all labs ordered are listed, but only abnormal results are  displayed) Labs Reviewed  COMPREHENSIVE METABOLIC PANEL - Abnormal; Notable for the following components:      Result Value   Glucose, Bld 108 (*)    All other components within normal limits  CBC WITH DIFFERENTIAL/PLATELET  D-DIMER, QUANTITATIVE (NOT AT [facility])  TROPONIN I (HIGH SENSITIVITY)  TROPONIN I (HIGH SENSITIVITY)    EKG EKG Interpretation  Date/Time:  Saturday June 11 2019 20:23:42 EST Ventricular Rate:  102 PR Interval:    QRS Duration: 75 QT Interval:  322 QTC Calculation: 420 R Axis:   62 Text Interpretation: Sinus tachycardia Probable left atrial enlargement Nonspecific T abnormalities, diffuse leads rate slightly faster but No significant change since last tracing Confirmed by Frederick Peers [phone]) on 06/11/2019 8:44:55 PM   Radiology DG Chest 2 View  Result Date: 06/11/2019 CLINICAL DATA:  Chest pain.  Shortness of breath. EXAM: CHEST - 2 VIEW COMPARISON:  October 23, 2017 FINDINGS: The heart size and mediastinal contours are within normal limits. Both lungs are clear. The visualized skeletal structures are unremarkable. IMPRESSION: No active cardiopulmonary disease. Electronically Signed   By: Katherine Mantle M.D.   On: 06/11/2019 21:31    Procedures Procedures (including critical care time)  Medications Ordered in ED Medications - No data to display  ED Course  I have reviewed the triage vital signs and the nursing notes.  Pertinent labs & imaging results that were available during my care of the patient were reviewed by me and considered in my medical decision making (see chart for details).    MDM Rules/Calculators/A&P                      Well appearing and pain free on my exam. EKG similar to previous. Mild tachycardia but otherwise reassuring VS. Labs show negative d-dimer, normal CMP and CBC. CXR clear. Repeat trop is pending.  HEART score is <3 and given atypical features, I feel he is safe for d/c with outpatient PCP f/u if second trop  negative. Pt signed out to oncoming provider. Final Clinical Impression(s) / ED Diagnoses Final diagnoses:  None    Rx / DC Orders ED Discharge Orders    None       Stepanie Graver, Ambrose Finland, MD 06/11/19 [id number]

## 2019-06-11 NOTE — ED Notes (Signed)
Pt to CT

## 2019-06-11 NOTE — ED Notes (Signed)
[phone] girl friend wants an updates Brandon Calderon

## 2019-06-11 NOTE — ED Triage Notes (Signed)
Pt arrived from home via [facility] c/o cp and sohb. Initially 8/10 pain, sharp, in middle of chest. Denies radiation, N/V, or lightheadedness. BP 180/P initially. Enroute pt given 1 nitro which improved cp to 2/10 and brought BP to 135/75. A+Ox4 currently. Allergic to ASA

## 2019-06-12 LAB — TROPONIN I (HIGH SENSITIVITY): Troponin I (High Sensitivity): <2 ng/L (ref <18)

## 2019-06-12 NOTE — ED Notes (Signed)
Pt verbalized understanding of d/c instructions and s/s requiring return to ed. Pt had no further questions at this time.

## 2019-06-12 NOTE — ED Provider Notes (Signed)
Care assumed from Dr. Clarene Duke, patient with atypical chest pain and negative initial work-up pending second troponin.  Second troponin is normal, patient is discharged with instructions to follow-up with PCP.   Dione Booze, MD 06/12/19 [phone]

## 2020-11-25 IMAGING — CR DG CHEST 2V
2 series · 2 of 2 positions shown · non-contrast
Comparison: October 23, 2017

CLINICAL DATA: Chest pain.  Shortness of breath.

EXAM:
CHEST - 2 VIEW

[chest pa]
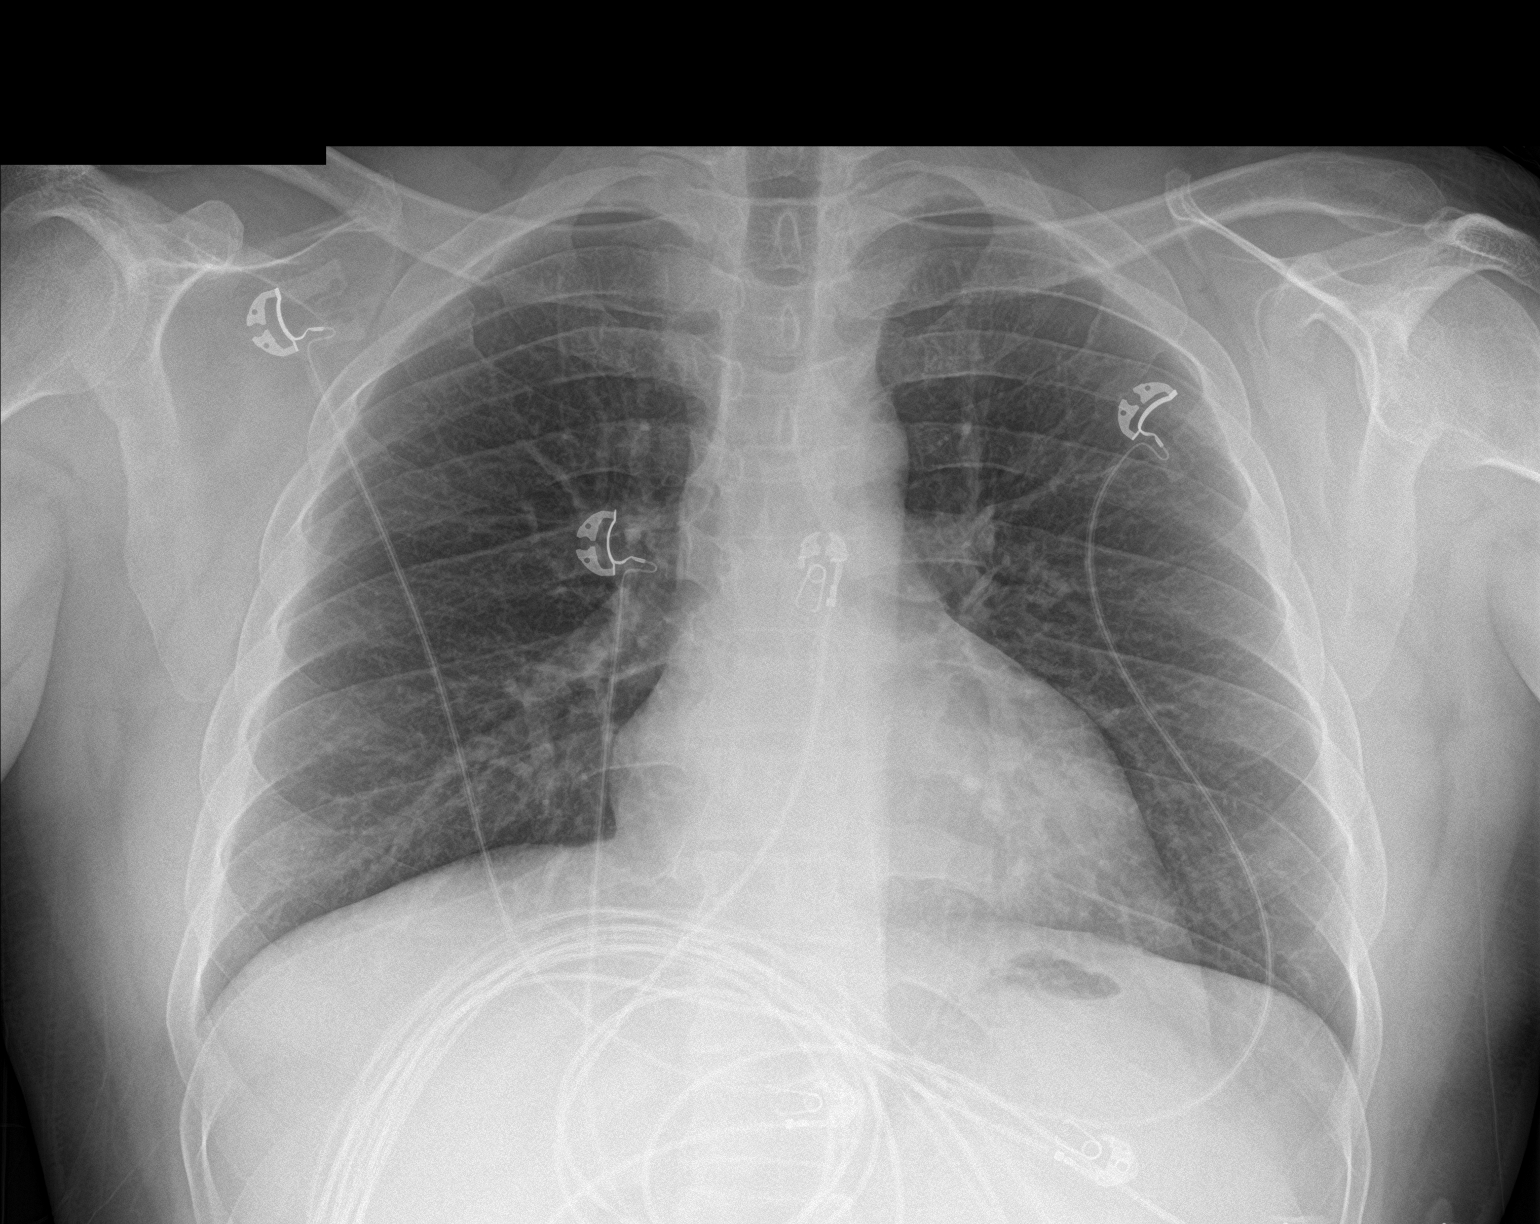

[chest lat]
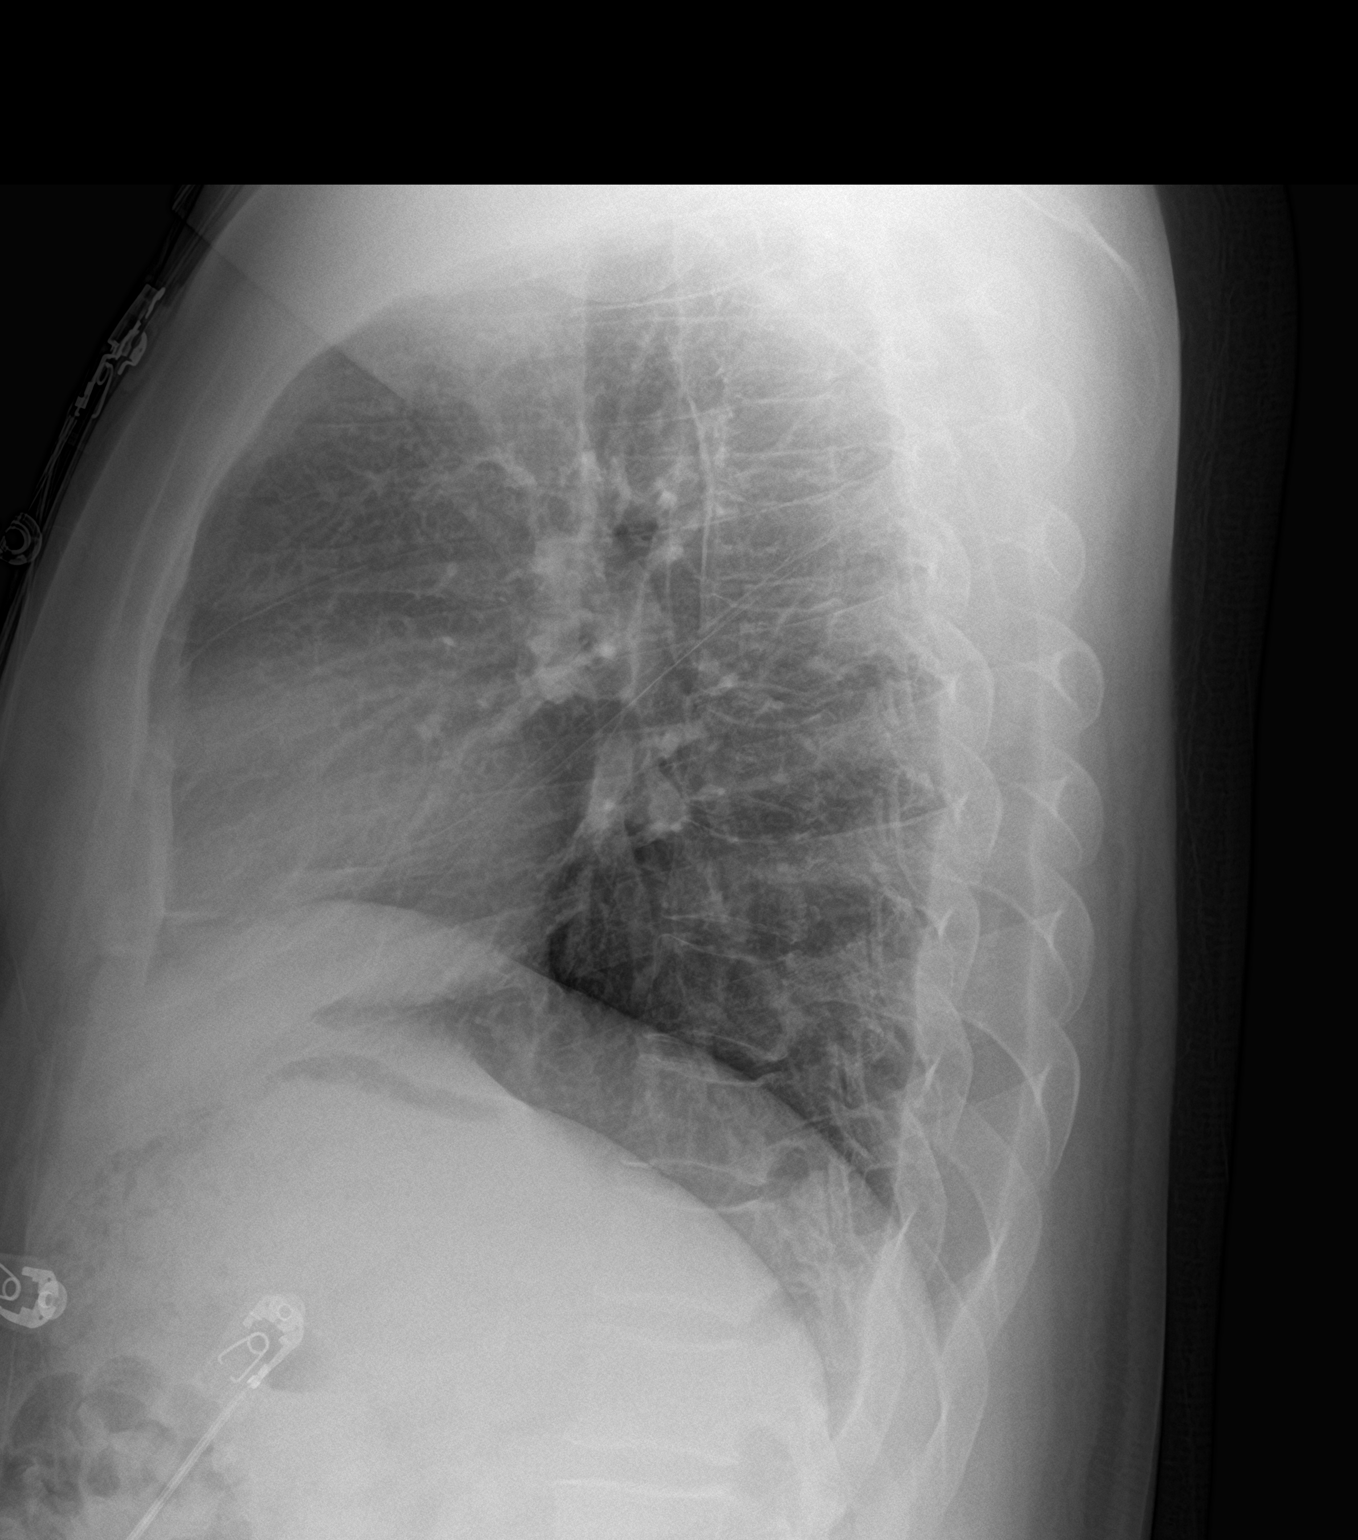

[2 of 2 positions shown; findings below may reference images not displayed]

FINDINGS: The heart size and mediastinal contours are within normal limits.
Both lungs are clear. The visualized skeletal structures are
unremarkable.
IMPRESSION: No active cardiopulmonary disease.
